# Patient Record
Sex: Female | Born: 1965 | Race: White | Hispanic: No | Smoking: Never smoker
Health system: Southern US, Community
[De-identification: ages and names within clinical notes are randomized; demographics above are authoritative.]

## PROBLEM LIST (undated history)

## (undated) DIAGNOSIS — K219 Gastro-esophageal reflux disease without esophagitis: Secondary | ICD-10-CM

## (undated) DIAGNOSIS — M26609 Unspecified temporomandibular joint disorder, unspecified side: Secondary | ICD-10-CM

## (undated) DIAGNOSIS — K449 Diaphragmatic hernia without obstruction or gangrene: Secondary | ICD-10-CM

## (undated) DIAGNOSIS — I10 Essential (primary) hypertension: Secondary | ICD-10-CM

---

## 2006-11-05 ENCOUNTER — Emergency Department (HOSPITAL_COMMUNITY): Admission: EM | Admit: 2006-11-05 | Discharge: 2006-11-05 | Payer: Self-pay | Admitting: Emergency Medicine

## 2014-06-17 ENCOUNTER — Encounter (HOSPITAL_BASED_OUTPATIENT_CLINIC_OR_DEPARTMENT_OTHER): Payer: Self-pay

## 2014-06-17 ENCOUNTER — Emergency Department (HOSPITAL_BASED_OUTPATIENT_CLINIC_OR_DEPARTMENT_OTHER)
Admission: EM | Admit: 2014-06-17 | Discharge: 2014-06-17 | Disposition: A | Discharge status: Other Acute Inpatient Hospital | Payer: 59 | Attending: Emergency Medicine | Admitting: Emergency Medicine

## 2014-06-17 ENCOUNTER — Emergency Department (HOSPITAL_BASED_OUTPATIENT_CLINIC_OR_DEPARTMENT_OTHER): Payer: 59

## 2014-06-17 DIAGNOSIS — I10 Essential (primary) hypertension: Secondary | ICD-10-CM | POA: Diagnosis not present

## 2014-06-17 DIAGNOSIS — R1084 Generalized abdominal pain: Secondary | ICD-10-CM | POA: Diagnosis present

## 2014-06-17 DIAGNOSIS — K219 Gastro-esophageal reflux disease without esophagitis: Secondary | ICD-10-CM | POA: Diagnosis not present

## 2014-06-17 DIAGNOSIS — E876 Hypokalemia: Secondary | ICD-10-CM | POA: Diagnosis not present

## 2014-06-17 DIAGNOSIS — Z79899 Other long term (current) drug therapy: Secondary | ICD-10-CM | POA: Diagnosis not present

## 2014-06-17 DIAGNOSIS — R188 Other ascites: Secondary | ICD-10-CM | POA: Insufficient documentation

## 2014-06-17 HISTORY — DX: Gastro-esophageal reflux disease without esophagitis: K21.9

## 2014-06-17 HISTORY — DX: Diaphragmatic hernia without obstruction or gangrene: K44.9

## 2014-06-17 HISTORY — DX: Unspecified temporomandibular joint disorder, unspecified side: M26.609

## 2014-06-17 HISTORY — DX: Essential (primary) hypertension: I10

## 2014-06-17 LAB — CBC WITH DIFFERENTIAL/PLATELET
BASOS ABS: 0 10*3/uL (ref 0.0–0.1)
BASOS PCT: 0 % (ref 0–1)
EOS ABS: 0.1 10*3/uL (ref 0.0–0.7)
Eosinophils Relative: 1 % (ref 0–5)
HEMATOCRIT: 36 % (ref 36.0–46.0)
HEMOGLOBIN: 13 g/dL (ref 12.0–15.0)
LYMPHS PCT: 21 % (ref 12–46)
Lymphs Abs: 2.1 10*3/uL (ref 0.7–4.0)
MCH: 38.2 pg — ABNORMAL HIGH (ref 26.0–34.0)
MCHC: 36.1 g/dL — AB (ref 30.0–36.0)
MCV: 105.9 fL — AB (ref 78.0–100.0)
Monocytes Absolute: 1 10*3/uL (ref 0.1–1.0)
Monocytes Relative: 10 % (ref 3–12)
NEUTROS ABS: 6.8 10*3/uL (ref 1.7–7.7)
NEUTROS PCT: 68 % (ref 43–77)
Platelets: 323 10*3/uL (ref 150–400)
RBC: 3.4 MIL/uL — ABNORMAL LOW (ref 3.87–5.11)
RDW: 12.8 % (ref 11.5–15.5)
WBC: 10.1 10*3/uL (ref 4.0–10.5)

## 2014-06-17 LAB — COMPREHENSIVE METABOLIC PANEL
ALBUMIN: 2.9 g/dL — AB (ref 3.5–5.2)
ALK PHOS: 237 U/L — AB (ref 39–117)
ALT: 39 U/L — AB (ref 0–35)
AST: 151 U/L — AB (ref 0–37)
Anion gap: 6 (ref 5–15)
BUN: <5 mg/dL — ABNORMAL LOW (ref 6–23)
CALCIUM: 7.7 mg/dL — AB (ref 8.4–10.5)
CO2: 24 mmol/L (ref 19–32)
Chloride: 104 mmol/L (ref 96–112)
Creatinine, Ser: 0.38 mg/dL — ABNORMAL LOW (ref 0.50–1.10)
GFR calc Af Amer: >90 mL/min (ref >90)
GLUCOSE: 137 mg/dL — AB (ref 70–99)
Potassium: 2.6 mmol/L — CL (ref 3.5–5.1)
Sodium: 134 mmol/L — ABNORMAL LOW (ref 135–145)
TOTAL PROTEIN: 6.6 g/dL (ref 6.0–8.3)
Total Bilirubin: 2.2 mg/dL — ABNORMAL HIGH (ref 0.3–1.2)

## 2014-06-17 LAB — URINALYSIS, ROUTINE W REFLEX MICROSCOPIC
GLUCOSE, UA: NEGATIVE mg/dL
Hgb urine dipstick: NEGATIVE
KETONES UR: NEGATIVE mg/dL
LEUKOCYTES UA: NEGATIVE
Nitrite: NEGATIVE
PH: 6 (ref 5.0–8.0)
Protein, ur: NEGATIVE mg/dL
Specific Gravity, Urine: 1.009 (ref 1.005–1.030)
Urobilinogen, UA: 1 mg/dL (ref 0.0–1.0)

## 2014-06-17 LAB — LIPASE, BLOOD: Lipase: 29 U/L (ref 11–59)

## 2014-06-17 MED ORDER — POTASSIUM CHLORIDE 10 MEQ/100ML IV SOLN
10.0000 meq | Freq: Once | INTRAVENOUS | Status: AC
  Administered 2014-06-17: 10 meq via INTRAVENOUS
  Filled 2014-06-17: qty 100

## 2014-06-17 MED ORDER — IOHEXOL 300 MG/ML  SOLN: 25.0000 mL | Freq: Once | INTRAMUSCULAR | Status: DC | PRN

## 2014-06-17 MED ORDER — SODIUM CHLORIDE 0.9 % IV BOLUS (SEPSIS)
500.0000 mL | Freq: Once | INTRAVENOUS | Status: AC
  Administered 2014-06-17: 500 mL via INTRAVENOUS

## 2014-06-17 MED ORDER — POTASSIUM CHLORIDE CRYS ER 20 MEQ PO TBCR
40.0000 meq | EXTENDED_RELEASE_TABLET | Freq: Once | ORAL | Status: AC
  Administered 2014-06-17: 40 meq via ORAL
  Filled 2014-06-17: qty 2

## 2014-06-17 MED ORDER — IOHEXOL 300 MG/ML  SOLN
25.0000 mL | Freq: Once | INTRAMUSCULAR | Status: AC | PRN
  Administered 2014-06-17: 25 mL via ORAL

## 2014-06-17 MED ORDER — IOHEXOL 300 MG/ML  SOLN
100.0000 mL | Freq: Once | INTRAMUSCULAR | Status: AC | PRN
  Administered 2014-06-17: 100 mL via INTRAVENOUS

## 2014-06-17 NOTE — ED Provider Notes (Signed)
CSN: [id number]     Arrival date & time 06/17/14  0919 History   First MD Initiated Contact with Patient 06/17/14 [phone]     Chief Complaint  Patient presents with  . Abdominal Pain     (Consider location/radiation/quality/duration/timing/severity/associated sxs/prior Treatment) HPI Comments: Patient is a 49 year old female with history of hypertension and esophageal reflux. She presents today for evaluation of abdominal discomfort and increased abdominal swelling over the past week. She states she has been unable to have a bowel movement during this period of time. She is attempted several laxatives without success. She does have a history of a ventral hernia however this does not feel more painful or swollen to her.  Patient is a 49 y.o. female presenting with abdominal pain. The history is provided by the patient.  Abdominal Pain Pain location:  Generalized Pain quality: cramping   Pain radiates to:  Does not radiate Pain severity:  Moderate Onset quality:  Gradual Duration:  1 week Timing:  Constant Progression:  Worsening Chronicity:  New Relieved by:  Nothing Worsened by:  Nothing tried Ineffective treatments:  None tried   Past Medical History  Diagnosis Date  . Hypertension   . TMJ (temporomandibular joint disorder)   . GERD (gastroesophageal reflux disease)   . Hiatal hernia    History reviewed. No pertinent past surgical history. No family history on file. History  Substance Use Topics  . Smoking status: Never Smoker   . Smokeless tobacco: Not on file  . Alcohol Use: Yes   OB History    No data available     Review of Systems  Gastrointestinal: Positive for abdominal pain.  All other systems reviewed and are negative.     Allergies  Review of patient's allergies indicates no known allergies.  Home Medications   Prior to Admission medications   Medication Sig Start Date End Date Taking? Authorizing Provider  benazepril (LOTENSIN) 10 MG tablet Take  10 mg by mouth daily.   Yes Historical Provider, MD  medroxyPROGESTERone (DEPO-PROVERA) 150 MG/ML injection Inject 150 mg into the muscle every 3 (three) months.   Yes Historical Provider, MD  pantoprazole (PROTONIX) 20 MG tablet Take 20 mg by mouth daily.   Yes Historical Provider, MD   BP 156/94 mmHg  Pulse 109  Temp(Src) 98.2 F (36.8 C) (Oral)  Resp 20  Ht 5\' 7"  (1.702 m)  Wt 185 lb (83.915 kg)  BMI 28.97 kg/m2  SpO2 98% Physical Exam  Constitutional: She is oriented to person, place, and time. She appears well-developed and well-nourished. No distress.  HENT:  Head: Normocephalic and atraumatic.  Neck: Normal range of motion. Neck supple.  Cardiovascular: Normal rate and regular rhythm.  Exam reveals no gallop and no friction rub.   No murmur heard. Pulmonary/Chest: Effort normal and breath sounds normal. No respiratory distress. She has no wheezes.  Abdominal: Soft. Bowel sounds are normal. She exhibits distension. There is tenderness. There is no rebound and no guarding.  The abdomen is somewhat distended with tenderness to palpation in all 4 quadrants. There is no rigidity and no rebound or guarding.  Musculoskeletal: Normal range of motion.  Neurological: She is alert and oriented to person, place, and time.  Skin: Skin is warm and dry. She is not diaphoretic.  Nursing note and vitals reviewed.   ED Course  Procedures (including critical care time) Labs Review Labs Reviewed  COMPREHENSIVE METABOLIC PANEL  LIPASE, BLOOD  CBC WITH DIFFERENTIAL/PLATELET  URINALYSIS, ROUTINE W REFLEX MICROSCOPIC  Imaging Review No results found.   EKG Interpretation None      MDM   Final diagnoses:  None    Patient is a 49 year old female who presents for evaluation of abdominal discomfort, constipation, and abdominal distention. Her workup reveals mild elevations of her liver functions and CT scan shows ascites. Her liver also appears abnormal and radiology has recommended  further imaging, likely an MRI. She is also found to have a potassium of 2.6. I discussed these findings with Dr. Lanae Boast, the patient's gastroenterologist here in [facility] it was recommending admission to the hospitalist service. I have spoken with Dr. Macky Lower who agrees to admit.  In speaking with this patient, it sounds as though she drinks quite a bit of alcohol and I suspect this may be the etiology of this apparent liver failure. The exact cause will be determined once admitted to [facility].    Geoffery Lyons, MD 06/17/14 [phone]

## 2014-06-17 NOTE — ED Notes (Signed)
Pt c/o piv site and right arm hurting with administration of potassium, stopped temporarily until able to speak with md regarding adding fluids to decrease pain while administrating further potassium.

## 2014-06-17 NOTE — ED Notes (Signed)
MD notified of critical value K 2.6.

## 2014-06-17 NOTE — ED Notes (Signed)
HP 1 is here trafer patient to [facility]

## 2014-06-17 NOTE — ED Notes (Signed)
Pt reports no normal bm x 5 days, thinks she has bowel obstruction, no hx of same.  Abd pain, distention.  Denies n/v although reports pain worse with eating.  Has attempted to use miralax and otc meds for constipation and no relief.

## 2014-06-17 NOTE — ED Notes (Signed)
MD at bedside to discuss results of testing and plan of care. 

## 2014-06-17 NOTE — ED Notes (Signed)
Pt reporting potassium infusion now tolerable with added fluids.  Spoke with pt regarding ascites, answered pt questions regarding possible additional testing and treatments for liver problems.  Informed admit md will decide the future plan of care once admitted.

## 2014-06-17 NOTE — ED Notes (Signed)
MD at bedside. 

## 2015-05-21 DEATH — deceased

## 2015-12-10 IMAGING — CT CT ABD-PELV W/ CM
2 of 5 series · 15 of 46 positions shown, 17 images · IV contrast (omnipaque)
Comparison: None.

CLINICAL DATA: Abdominal pain and distention; constipation

EXAM:
CT ABDOMEN AND PELVIS WITH CONTRAST
TECHNIQUE: Multidetector CT imaging of the abdomen and pelvis was performed
using the standard protocol following bolus administration of
intravenous contrast. Oral contrast was also administered.
CONTRAST:  25mL OMNIPAQUE IOHEXOL 300 MG/ML SOLN, 100mL OMNIPAQUE
IOHEXOL 300 MG/ML SOLN

[Series 2: abd/pelvis 5.0 b31f · axial · 0.84mm/px · z∈[+670,+1095]mm · 12 of 95 slices shown, 14 images]
[im 5/95  soft-tissue]
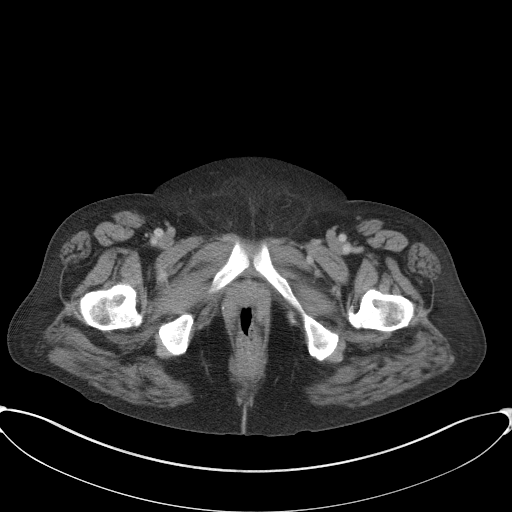
[im 5/95  bone]
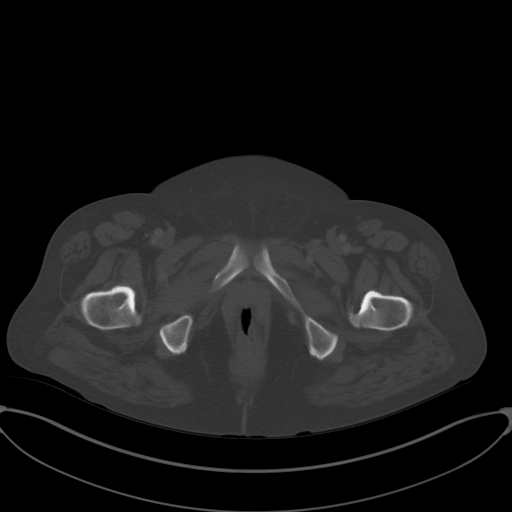
[im 15/95  soft-tissue]
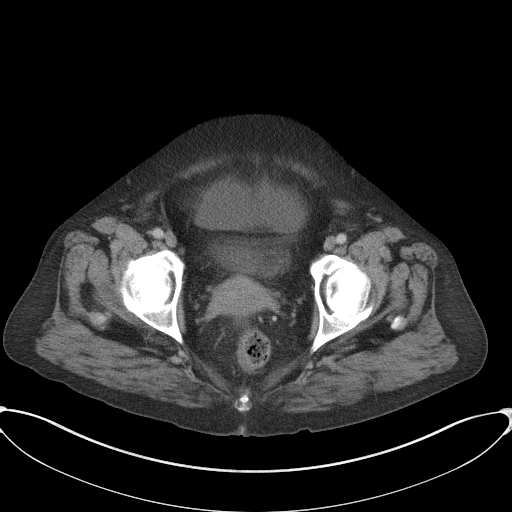
[im 20/95  soft-tissue]
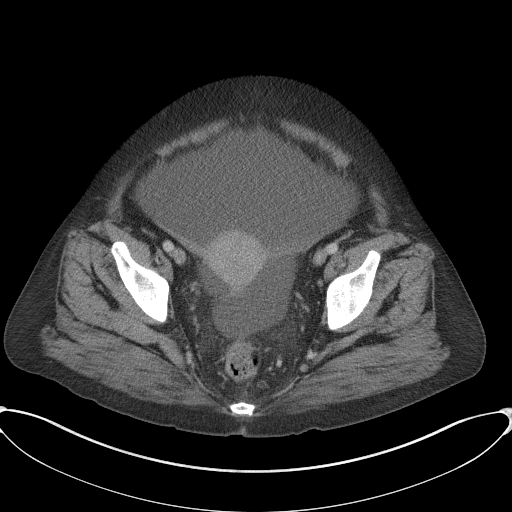
[im 30/95  soft-tissue]
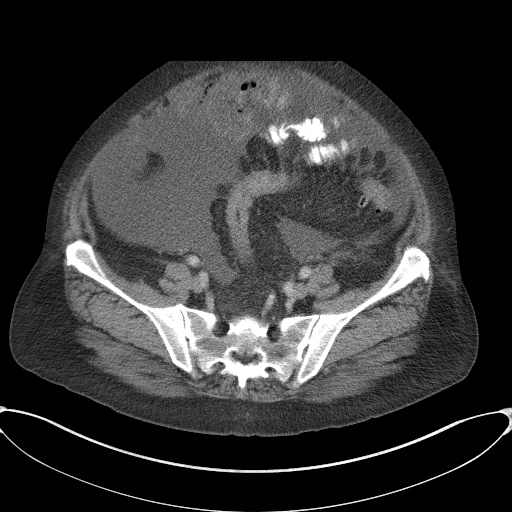
[im 35/95  soft-tissue]
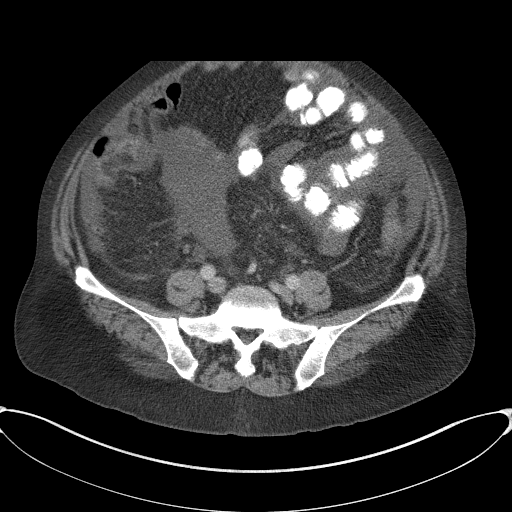
[im 45/95  soft-tissue]
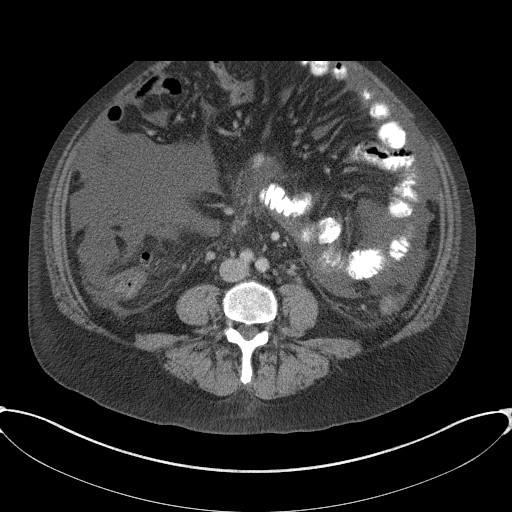
[im 50/95  soft-tissue]
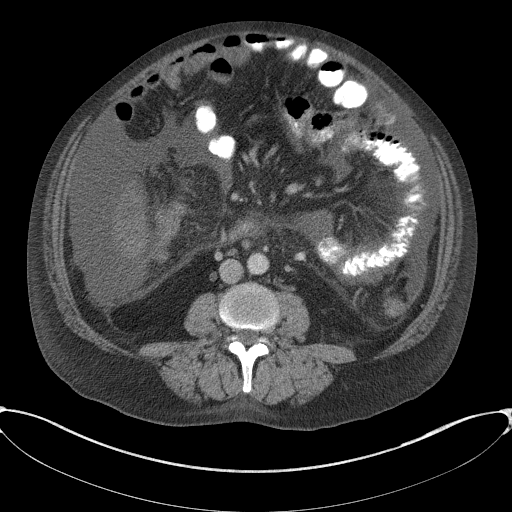
[im 60/95  soft-tissue]
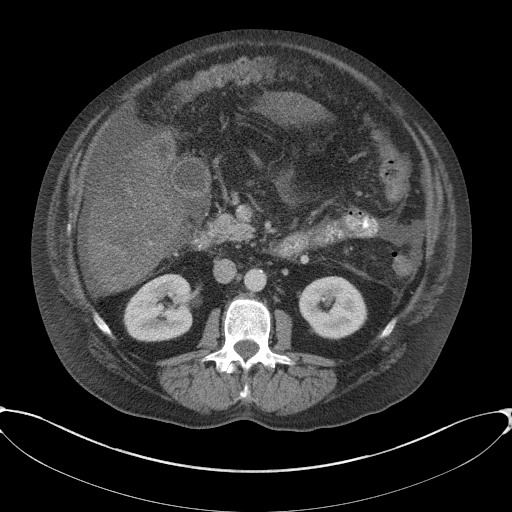
[im 65/95  soft-tissue]
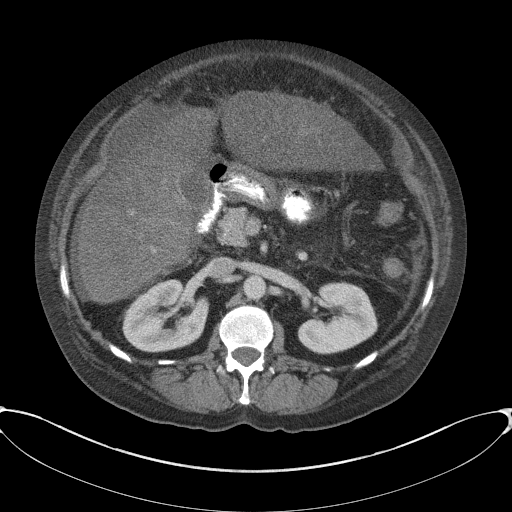
[im 65/95  bone]
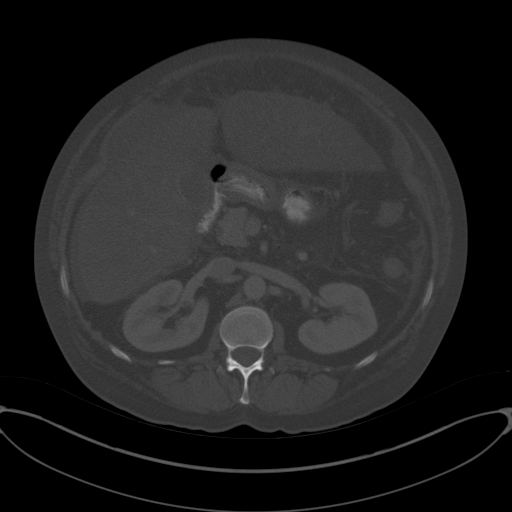
[im 75/95  soft-tissue]
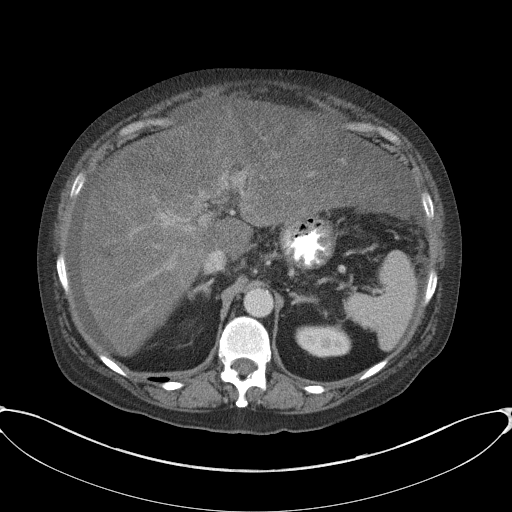
[im 80/95  soft-tissue]
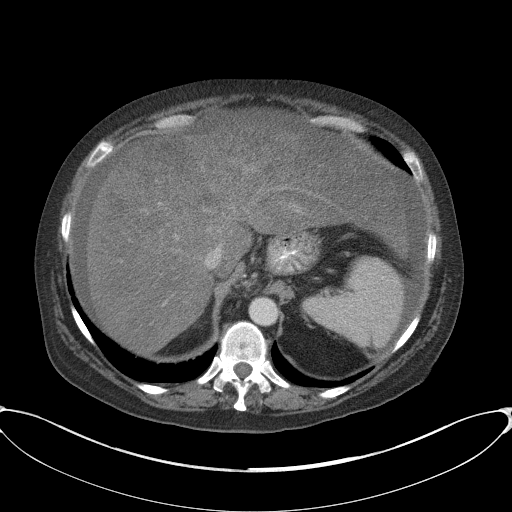
[im 90/95  soft-tissue]
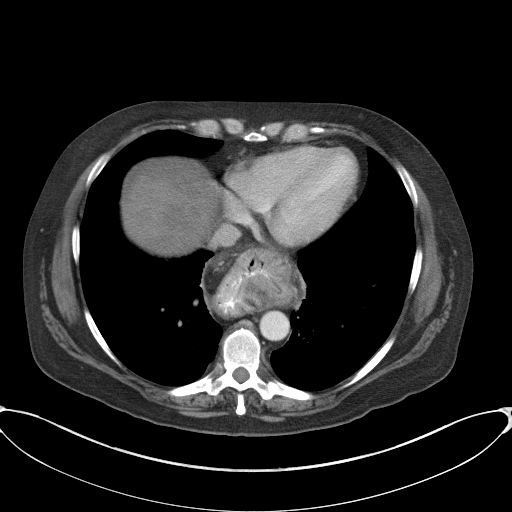

[Series 6: abd/pelvis 3.0 coronal · coronal · 0.76mm/px · 3 of 113 slices shown]
[im 38/113  soft-tissue]
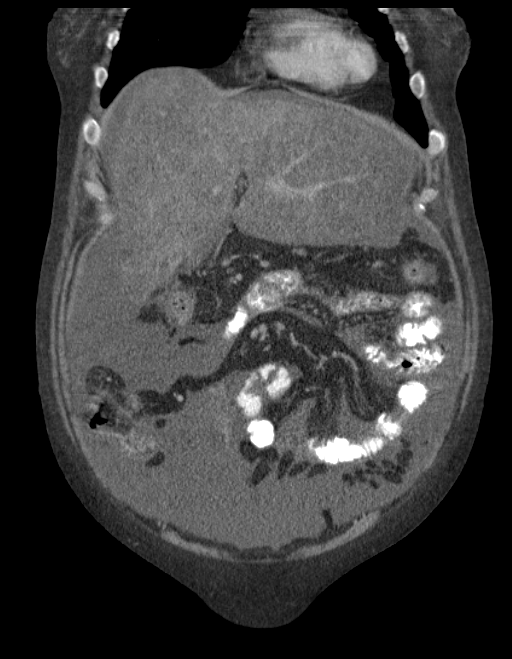
[im 50/113  soft-tissue]
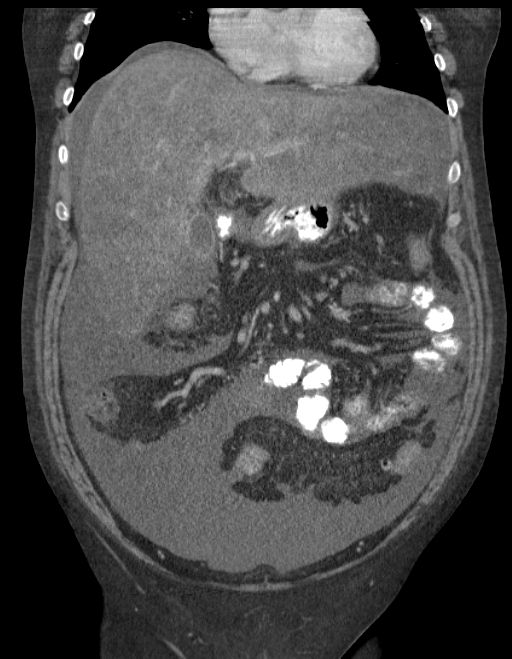
[im 63/113  soft-tissue]
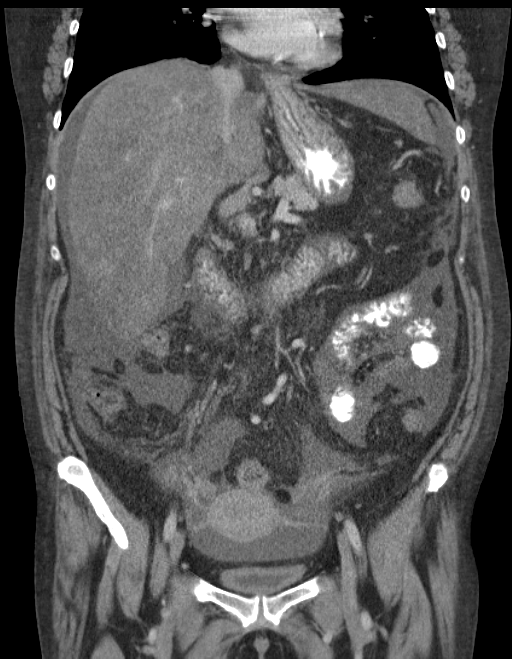

[15 of 46 positions shown; findings below may reference images not displayed]

FINDINGS: Lung bases are clear.  There is a moderate hiatal hernia.

Liver is prominent measuring 22.7 cm in length. There is diffuse
decreased attenuation throughout the liver consistent with hepatic
steatosis. The liver attenuation is inhomogeneous which could
indicate areas of more severe hepatic steatosis but also raise
concern for potential underlying areas of dysplastic change or
possibly even an infiltrative mass. There does not appear to be
appreciable disruption of the hepatic architecture. The gallbladder
wall is not thickened. There is no biliary duct dilatation.

Spleen appears within normal limits. There is a small splenule
medial to the inferior spleen.

Pancreas is normal in size and contour. Nares no pancreatic
inflammatory lesion or duct dilatation.

Adrenals appear normal. Kidneys show no evidence of hydronephrosis
on either side. There is a 9 mm cyst in the anterior mid left
kidney. There is no renal or ureteral calculus on either side.

There is moderate generalized ascites throughout the abdomen and
pelvis.

In the pelvis, there is thickening of the urinary bladder wall.
There is no pelvic mass appreciable. There is no periappendiceal
region inflammation.

The colon is largely collapsed. There does not appear to be actual
colonic wall thickening. There is no small bowel obstruction or
small bowel wall thickening. No free air or portal venous air. There
is a small amount of ascites tracking through a small ventral
hernia.

There is no abscess in the abdomen or pelvis. There are multiple
subcentimeter retroperitoneal lymph nodes which do not meet size
criteria for pathologic significance. There is no adenopathy by size
criteria. There is no demonstrable abdominal aortic aneurysm. There
is atherosclerotic change in the aorta. There are no blastic or
lytic bone lesions.
IMPRESSION: Moderate diffuse ascites throughout the abdomen and pelvis. The
greatest amount of ascites is in the anterior pelvis.

The liver is prominent with evidence of hepatic steatosis. The
attenuation of the liver is somewhat inhomogeneous. This finding
could be due to underlying parenchymal disease with dysplastic
change. An underlying liver mass cannot be excluded in this
circumstance. This patient may benefit from pre and post-contrast
liver MR to further assess. Certainly appropriate liver function
laboratory testing is advised.

Moderate hiatal hernia.  Small ventral hernia.

Suspect a degree of cystitis. There is no renal or ureteral
calculus. No hydronephrosis.

No bowel obstruction. No abscess. There does not appear to be
significant stool in the colon.
